# Patient Record
Sex: Female | Born: 1935 | Race: White | Hispanic: No | Marital: Married | Smoking: Former smoker
Health system: Southern US, Community
[De-identification: ages and names within clinical notes are randomized; demographics above are authoritative.]

## PROBLEM LIST (undated history)

## (undated) DIAGNOSIS — I341 Nonrheumatic mitral (valve) prolapse: Secondary | ICD-10-CM

## (undated) DIAGNOSIS — F329 Major depressive disorder, single episode, unspecified: Secondary | ICD-10-CM

## (undated) DIAGNOSIS — F32A Depression, unspecified: Secondary | ICD-10-CM

## (undated) HISTORY — PX: APPENDECTOMY: SHX54

## (undated) HISTORY — PX: OTHER SURGICAL HISTORY: SHX169

---

## 1997-08-01 ENCOUNTER — Other Ambulatory Visit: Admission: RE | Admit: 1997-08-01 | Discharge: 1997-08-01 | Payer: Self-pay | Admitting: Urology

## 1997-08-08 ENCOUNTER — Encounter: Admission: RE | Admit: 1997-08-08 | Discharge: 1997-11-06 | Payer: Self-pay | Admitting: *Deleted

## 1997-08-08 ENCOUNTER — Other Ambulatory Visit: Admission: RE | Admit: 1997-08-08 | Discharge: 1997-08-08 | Payer: Self-pay | Admitting: *Deleted

## 1998-03-26 ENCOUNTER — Other Ambulatory Visit: Admission: RE | Admit: 1998-03-26 | Discharge: 1998-03-26 | Payer: Self-pay | Admitting: *Deleted

## 1998-07-29 ENCOUNTER — Ambulatory Visit (HOSPITAL_COMMUNITY): Admission: RE | Admit: 1998-07-29 | Discharge: 1998-07-29 | Payer: Self-pay | Admitting: Obstetrics & Gynecology

## 1999-05-13 ENCOUNTER — Encounter: Admission: RE | Admit: 1999-05-13 | Discharge: 1999-05-13 | Payer: Self-pay | Admitting: *Deleted

## 1999-05-13 ENCOUNTER — Encounter: Payer: Self-pay | Admitting: *Deleted

## 1999-05-15 ENCOUNTER — Other Ambulatory Visit: Admission: RE | Admit: 1999-05-15 | Discharge: 1999-05-15 | Payer: Self-pay | Admitting: *Deleted

## 2003-02-14 ENCOUNTER — Encounter: Payer: Self-pay | Admitting: Family Medicine

## 2003-02-14 ENCOUNTER — Encounter: Admission: RE | Admit: 2003-02-14 | Discharge: 2003-02-14 | Payer: Self-pay | Admitting: Family Medicine

## 2006-01-31 ENCOUNTER — Encounter: Admission: RE | Admit: 2006-01-31 | Discharge: 2006-01-31 | Payer: Self-pay | Admitting: Family Medicine

## 2006-10-07 ENCOUNTER — Encounter: Admission: RE | Admit: 2006-10-07 | Discharge: 2006-10-07 | Payer: Self-pay | Admitting: Family Medicine

## 2007-05-30 ENCOUNTER — Ambulatory Visit: Payer: Self-pay | Admitting: Internal Medicine

## 2010-03-13 ENCOUNTER — Encounter: Admission: RE | Admit: 2010-03-13 | Discharge: 2010-03-13 | Payer: Self-pay | Admitting: Family Medicine

## 2010-09-15 NOTE — Assessment & Plan Note (Signed)
[facility]                         GASTROENTEROLOGY OFFICE NOTE   NAME:STRICKLANDAubrea, Meixner                 MRN:          [id number]  DATE:05/30/2007                            DOB:          Sep 18, 1918    HISTORY:  Ms. Matera is a delightful, 77 year old white female who  comes today complaining of change in her bowel habits.  She is a former  doctor of Sam Reynolds's patient who had a screening colonoscopy in  October 2003 with findings of diverticulitis of the left colon.  She had  a moderately severe sigmoid diverticulosis of a tortuous elongated  redundant colon.  She maintains her bowel habits by taking 1 teaspoon of  Cilium every day at bedtime.  Her bowel habits were regular until  recently when she started having more constipation.  Her eating habits  are quite healthy.  She eats blue[place] which [place]  She also eats a little asparagus which they grow.  She works out at the __________ every day.  She does water aerobics, and  her weight has been maintained around 118 pounds.  [place].   MEDICATIONS:  Multiple vitamins, B complex, Vitamin C, calcium  supplements, magnesium and cilium 1 teaspoon q h.s.   PAST HISTORY:  Significant for depression 1959 to 1962, mitral valve  prolapse, benign breast cyst biopsies in 1993, appendectomy in 1937.  She had a pelvic sling by Dr. Patsi Sears in 1989.  She has been a  regular blood donor, given 17-1/2 gallons of blood to [facility].   FAMILY HISTORY:  Negative for colon cancer, positive for diabetes,  breast cancer and heart disease.   SOCIAL HISTORY:  Married with four children.  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  She does not smoke.  Drinks a  glass of wine every day.   REVIEW OF SYSTEMS:  Her weight has been stable.  She wears eyeglasses.  She has back pain, has  some hearing problems and leakage of urine.   PHYSICAL EXAMINATION:  VITAL SIGNS:  Blood pressure 134/74, pulse 72 and  weight 118 pounds.  GENERAL:  She was alert, oriented, appeared younger than her stated age.  LUNGS:  Clear to auscultation.  COR:  Normal S1, normal S2.  ABDOMEN:  Soft, nontender with normoactive bowel sounds.  RECTAL EXAM:  Showed somewhat decreased rectal tone.  There was a small  amount of stool which was hemoccult negative.   IMPRESSION:  An 77 year old white female with change in her bowel habits  which is most likely due to increased colonic motility, decreased eating  habits and less activity.  There is no evidence of occult GI  blood loss  and no risk factors of colon cancer.   PLAN:  At her age, I would prefer to try to treat her constipation with  increased fiber, and if it does not work, we will consider colonoscopy.  She wanted a colonoscopy, but we will wait to see if she responds to  increased  cilium.  She will take 1 teaspoon twice a day instead of once  a day, increase her fluid intake, maintain her activity and eating  habits.  She will let us know about the results [place]  If there is any progression of her bowel  irregularities, we will consider colonoscopy before April of this year.     Hedwig Morton. Juanda Chance, MD  Electronically Signed    DMB/MedQ  DD: 05/30/2007  DT: 05/30/2007  Job #: [id number]   cc:   Gretta Arab. Valentina Lucks, M.D.

## 2011-04-05 IMAGING — US US EXTREM LOW VENOUS*L*
1 series · 14 of 24 positions shown · non-contrast
Comparison: None.

CLINICAL DATA: Fell 5 weeks ago, presenting now with 2-day history
of left lower extremity swelling.

LEFT LOWER EXTREMITY VENOUS DOPPLER ULTRASOUND 03/13/2010:
TECHNIQUE: Gray-scale sonography with compression, as well as
color and duplex Doppler ultrasound, were performed to evaluate the
deep venous system from the level of the common femoral vein
through the popliteal and proximal calf veins. Evaluation also
included physiologic response to augmentation.

[Series 1: us extrem low venous*left* · 0.09mm/px · 14 of 42 slices shown]
[im 1/42]
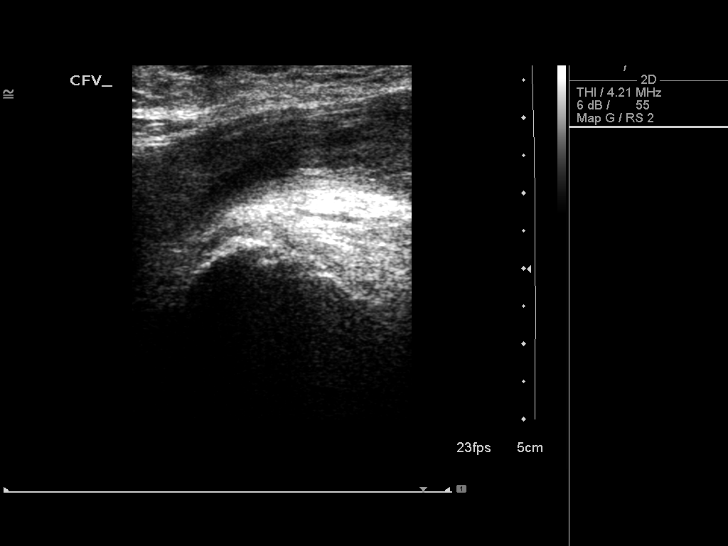
[im 4/42]
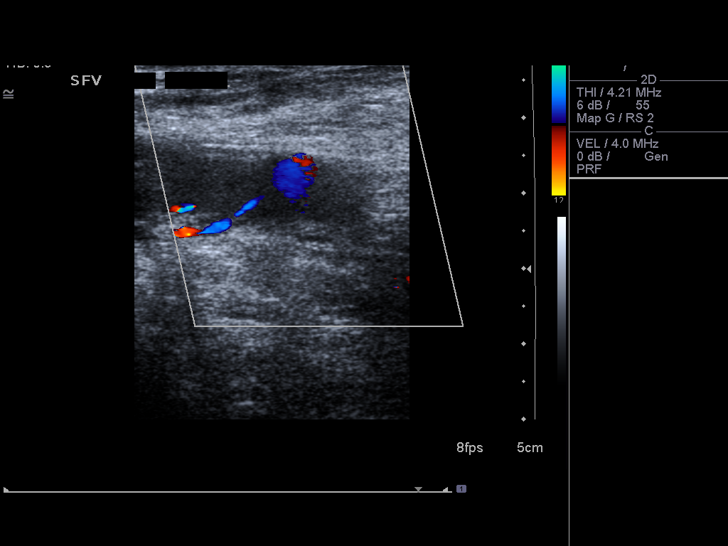
[im 8/42]
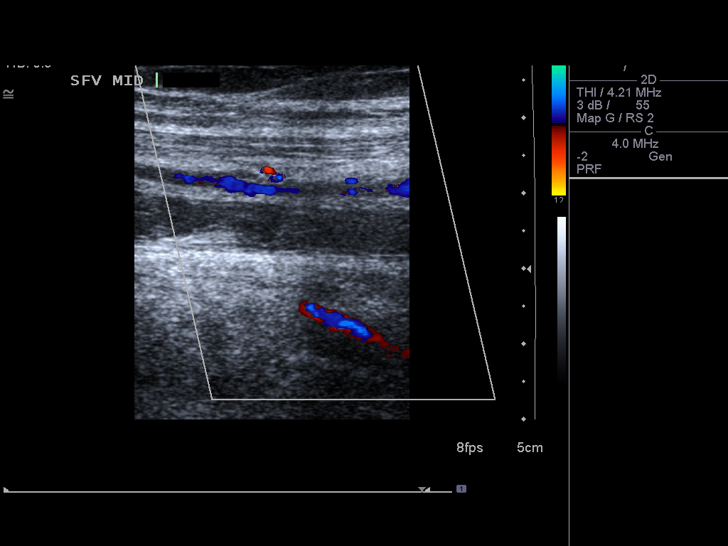
[im 11/42]
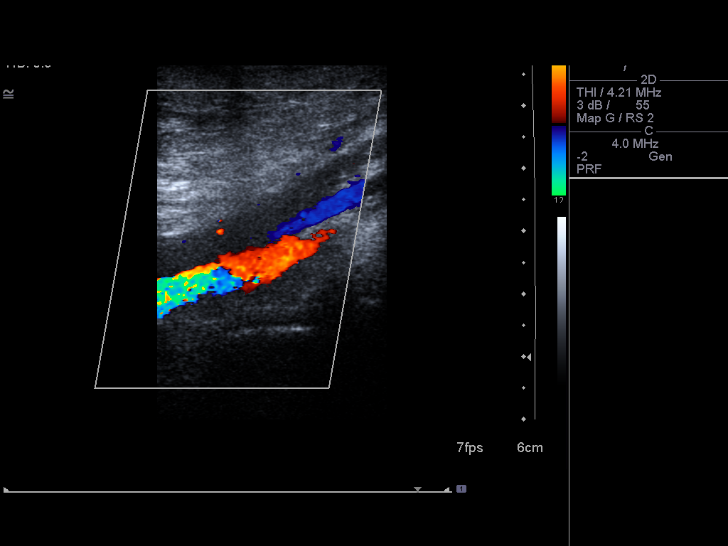
[im 13/42]
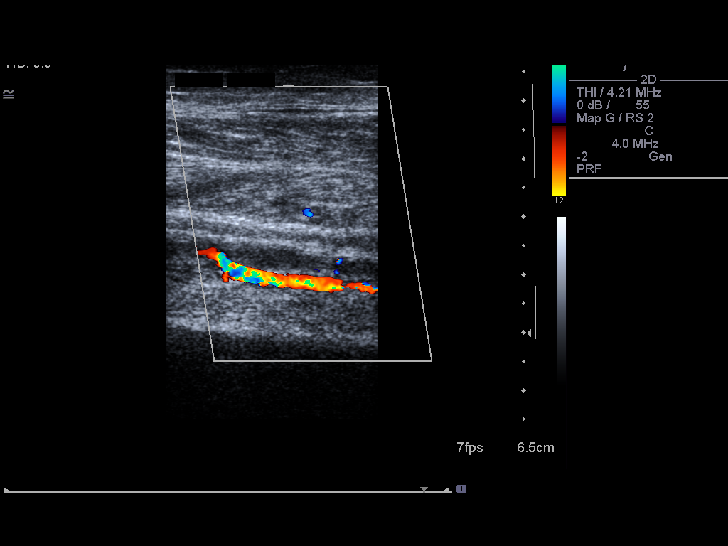
[im 17/42]
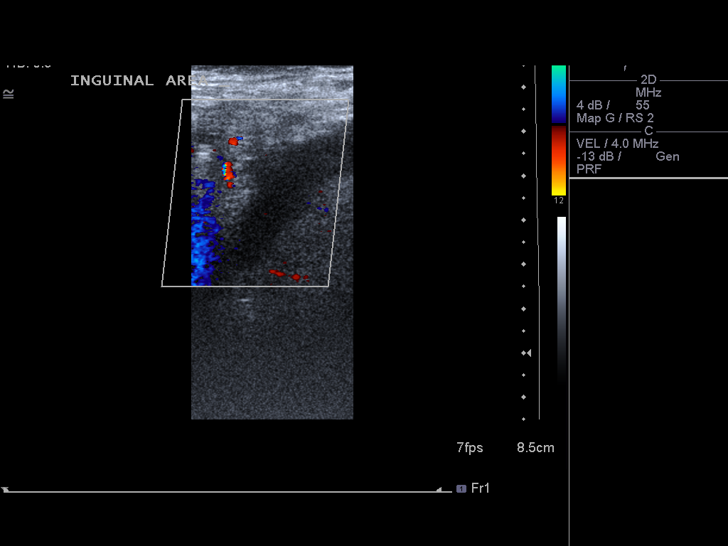
[im 20/42]
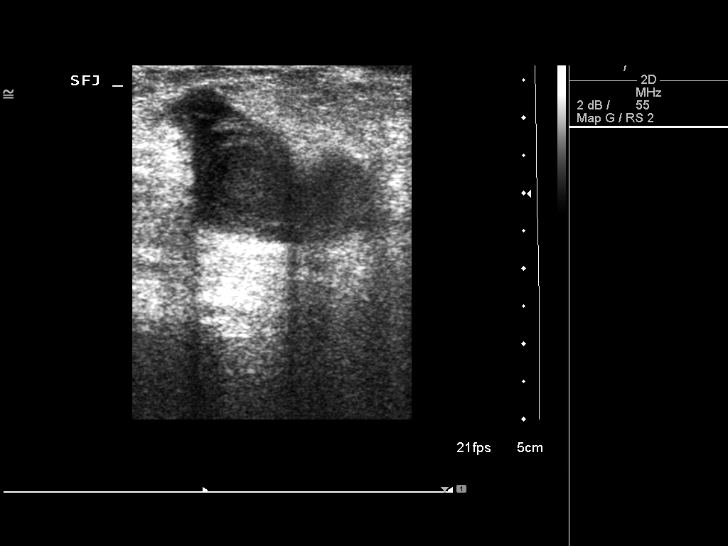
[im 22/42]
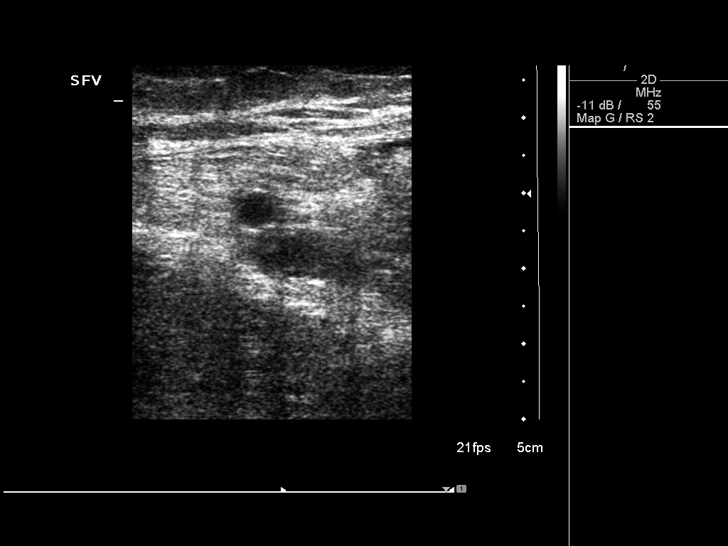
[im 25/42]
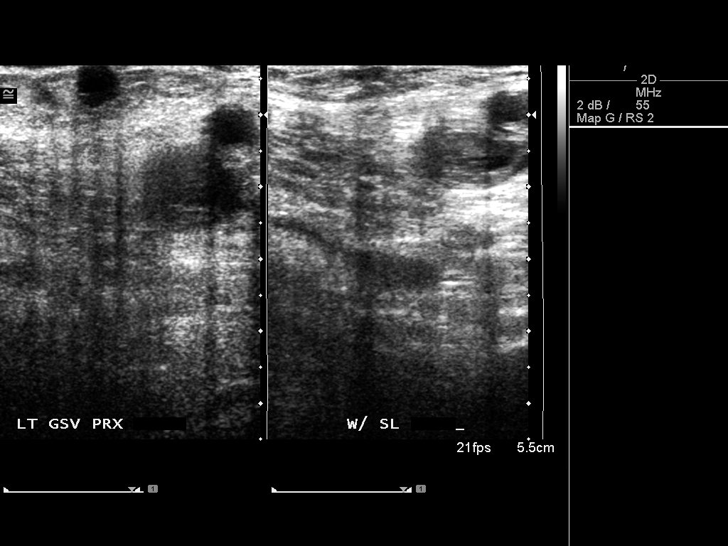
[im 29/42]
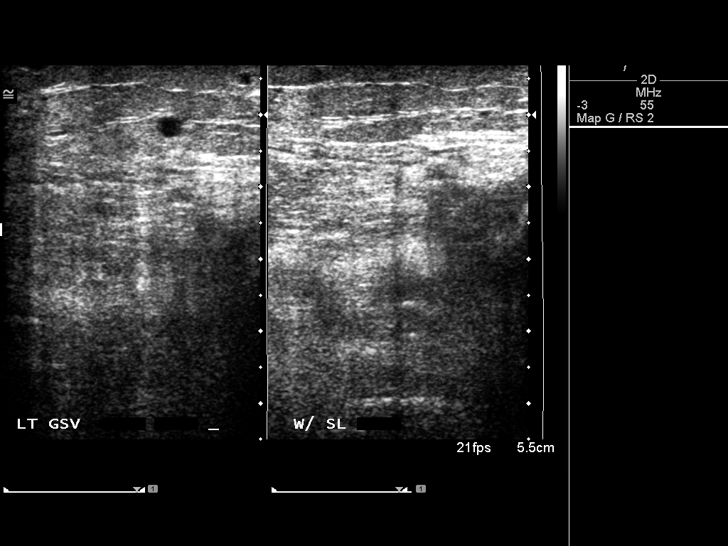
[im 33/42]
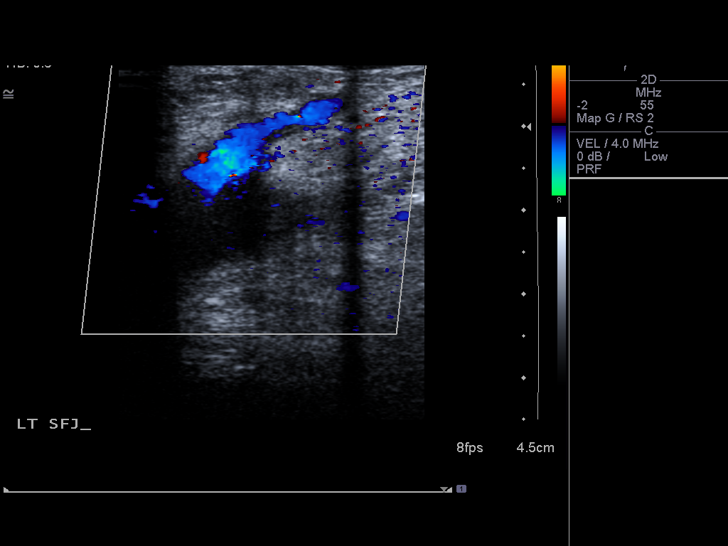
[im 34/42]
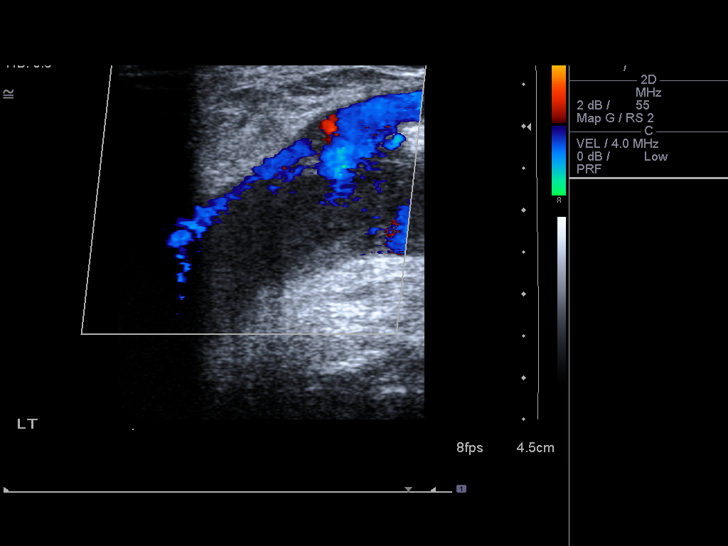
[im 38/42]
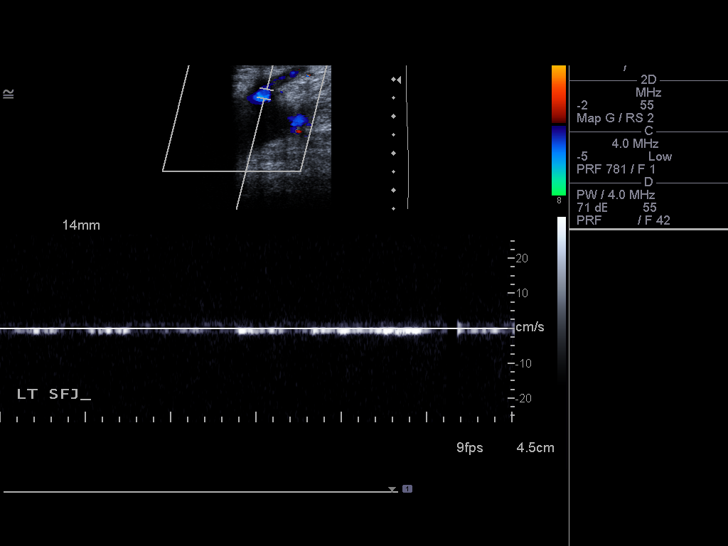
[im 42/42]
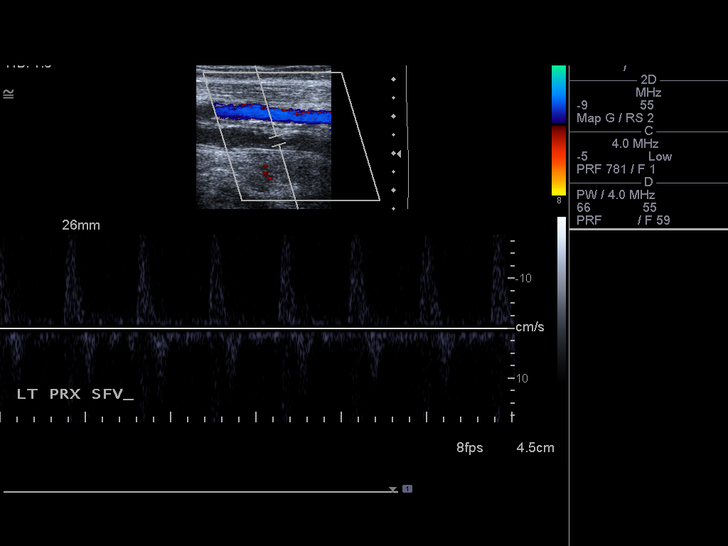

[14 of 24 positions shown; findings below may reference images not displayed]

FINDINGS: Gray scale occlusive filling defects extending from the
common femoral vein through the femoral vein, deep femoral vein,
popliteal vein, and visualized calf veins consistent with extensive
left lower extremity DVT.  Only scattered areas of minimal color
Doppler signal are identified in these veins.  Thrombus extends
into the central portion of the greater saphenous vein as it enters
the common femoral vein, though the remainder of the saphenous vein
appears patent.
IMPRESSION: Extensive left lower extremity DVT extending from the visualized
common femoral vein through the femoral vein, deep femoral vein,
popliteal vein, and visualized calf veins.

## 2014-01-25 ENCOUNTER — Emergency Department (HOSPITAL_COMMUNITY)
Admission: EM | Admit: 2014-01-25 | Discharge: 2014-01-25 | Disposition: A | Discharge status: Home / Self Care | Payer: Medicare Other | Attending: Emergency Medicine | Admitting: Emergency Medicine

## 2014-01-25 ENCOUNTER — Encounter (HOSPITAL_COMMUNITY): Payer: Self-pay | Admitting: Emergency Medicine

## 2014-01-25 DIAGNOSIS — M542 Cervicalgia: Secondary | ICD-10-CM | POA: Diagnosis not present

## 2014-01-25 DIAGNOSIS — Z87891 Personal history of nicotine dependence: Secondary | ICD-10-CM | POA: Insufficient documentation

## 2014-01-25 LAB — CBC WITH DIFFERENTIAL/PLATELET
BASOS ABS: 0 10*3/uL (ref 0.0–0.1)
Basophils Relative: 0 % (ref 0–1)
Eosinophils Absolute: 0.1 10*3/uL (ref 0.0–0.7)
Eosinophils Relative: 1 % (ref 0–5)
HCT: 43.7 % (ref 36.0–46.0)
HEMOGLOBIN: 14.8 g/dL (ref 12.0–15.0)
LYMPHS PCT: 14 % (ref 12–46)
Lymphs Abs: 1 10*3/uL (ref 0.7–4.0)
MCH: 31 pg (ref 26.0–34.0)
MCHC: 33.9 g/dL (ref 30.0–36.0)
MCV: 91.4 fL (ref 78.0–100.0)
Monocytes Absolute: 0.4 10*3/uL (ref 0.1–1.0)
Monocytes Relative: 5 % (ref 3–12)
NEUTROS ABS: 5.7 10*3/uL (ref 1.7–7.7)
NEUTROS PCT: 80 % — AB (ref 43–77)
Platelets: 211 10*3/uL (ref 150–400)
RBC: 4.78 MIL/uL (ref 3.87–5.11)
RDW: 13 % (ref 11.5–15.5)
WBC: 7.2 10*3/uL (ref 4.0–10.5)

## 2014-01-25 LAB — I-STAT CHEM 8, ED
BUN: 21 mg/dL (ref 6–23)
CHLORIDE: 103 meq/L (ref 96–112)
Calcium, Ion: 1.17 mmol/L (ref 1.13–1.30)
Creatinine, Ser: 0.7 mg/dL (ref 0.50–1.10)
Glucose, Bld: 102 mg/dL — ABNORMAL HIGH (ref 70–99)
HEMATOCRIT: 48 % — AB (ref 36.0–46.0)
Hemoglobin: 16.3 g/dL — ABNORMAL HIGH (ref 12.0–15.0)
POTASSIUM: 4 meq/L (ref 3.7–5.3)
Sodium: 140 mEq/L (ref 137–147)
TCO2: 25 mmol/L (ref 0–100)

## 2014-01-25 MED ORDER — MORPHINE SULFATE 4 MG/ML IJ SOLN
2.0000 mg | Freq: Once | INTRAMUSCULAR | Status: AC
  Administered 2014-01-25: 2 mg via INTRAVENOUS
  Filled 2014-01-25: qty 1

## 2014-01-25 NOTE — Progress Notes (Signed)
PTAR arranged to transport pt back to [facility] [place].

## 2014-01-25 NOTE — ED Notes (Signed)
Pt daughter has called back and case manager was able to talk to her and discuss concerns about her being forgetful etc.

## 2014-01-25 NOTE — ED Notes (Signed)
Case Management + Social worker at bedside.

## 2014-01-25 NOTE — ED Notes (Signed)
Pt complaining of neck pain radiating into head.

## 2014-01-25 NOTE — Discharge Instructions (Signed)
Please call your doctor for a followup appointment within 24-48 hours. When you talk to your doctor please let them know that you were seen in the emergency department and have them acquire all of your records so that they can discuss the findings with you and formulate a treatment plan to fully care for your new and ongoing problems.  Return for fever, vomiting, worsening pain.

## 2014-01-25 NOTE — ED Notes (Signed)
Per EMS: complains of ha, pt hypertensive 184/77. No obvious weaknesses, a/o x4. CBG = 101. 71bpm. Pt denies any falls/trauma to head. From [facility].

## 2014-01-25 NOTE — Clinical Social Work Note (Signed)
Clinical Social Worker received notification from CM that patient having memory issues and possible concerns with returning to independent living.  CSW and CM met with patient at bedside to offer support and discuss patient wishes for potential placement needs.  Patient is not able to accurately articulate her current needs or potential need for higher level of care.  CSW spoke with [facility] who states that they have noticed a significant change with patient confusion in the last several months and have offered a variety of services to come into patient home to assist - patient has refused.    CSW spoke with patient daughter over the phone who states that this behavior is similar to patient baseline and if patient is medically stable she will return to independent living at [facility] with additional services starting on Monday to assist.  CSW to arrange ambulance transport back to [place].  Barbette Or, [place]

## 2014-01-25 NOTE — ED Provider Notes (Signed)
CSN: [id number]     Arrival date & time 01/25/14  1007 History   First MD Initiated Contact with Patient 01/25/14 1016     Chief Complaint  Patient presents with  . Neck Pain     (Consider location/radiation/quality/duration/timing/severity/associated sxs/prior Treatment) HPI Comments: 80 year old female, presents from her living facility at [facility] with a complaint of neck stiffness and pain. She states that when she went to sleep last night she had no complaints, she awoke in the middle of the evening with neck pain which she states is persistent, worse with range of motion and prevents her from looking to the side or putting her chin on her chest. She denies fevers chills nausea vomiting coughing shortness of breath chest pain back pain leg pain arm pain weakness numbness rashes swelling visual changes or sore throat. She has no pain in her ears, no nasal congestion, no medications given prior to arrival. Paramedics noted that the patient was hypertensive 184/77, CBG was 101 and her pulse was 71 beats per minute.  Patient is a 80 y.o. female presenting with neck pain. The history is provided by the patient.  Neck Pain   History reviewed. No pertinent past medical history. History reviewed. No pertinent past surgical history. History reviewed. No pertinent family history. History  Substance Use Topics  . Smoking status: Former Games developer  . Smokeless tobacco: Not on file  . Alcohol Use: No   OB History   Grav Para Term Preterm Abortions TAB SAB Ect Mult Living                 Review of Systems  Musculoskeletal: Positive for neck pain.  All other systems reviewed and are negative.     Allergies  Review of patient's allergies indicates no known allergies.  Home Medications   Prior to Admission medications   Not on File   BP 170/64  Pulse 86  Temp(Src) 99.6 F (37.6 C) (Oral)  Resp 16  Ht  (1.651 m)  Wt 115 lb (52.164 kg)  BMI 19.14 kg/m2  SpO2 97% Physical  Exam  Nursing note and vitals reviewed. Constitutional: She appears well-developed and well-nourished. No distress.  HENT:  Head: Normocephalic and atraumatic.  Mouth/Throat: Oropharynx is clear and moist. No oropharyngeal exudate.  Eyes: Conjunctivae and EOM are normal. Pupils are equal, round, and reactive to light. Right eye exhibits no discharge. Left eye exhibits no discharge. No scleral icterus.  Neck: No JVD present. No thyromegaly present.  Restrictive range of motion of the neck, tenderness over the bilateral neck, posterior neck and paracervical muscles  Cardiovascular: Normal rate, regular rhythm, normal heart sounds and intact distal pulses.  Exam reveals no gallop and no friction rub.   No murmur heard. Pulmonary/Chest: Effort normal and breath sounds normal. No respiratory distress. She has no wheezes. She has no rales.  Abdominal: Soft. Bowel sounds are normal. She exhibits no distension and no mass. There is no tenderness.  Musculoskeletal: Normal range of motion. She exhibits no edema and no tenderness.  Lymphadenopathy:    She has no cervical adenopathy.  Neurological: She is alert. Coordination normal.  Skin: Skin is warm and dry. No rash noted. No erythema.  Psychiatric: She has a normal mood and affect. Her behavior is normal.    ED Course  Procedures (including critical care time) Labs Review Labs Reviewed  CBC WITH DIFFERENTIAL - Abnormal; Notable for the following:    Neutrophils Relative % 80 (*)    All other  components within normal limits  I-STAT CHEM 8, ED - Abnormal; Notable for the following:    Glucose, Bld 102 (*)    Hemoglobin 16.3 (*)    HCT 48.0 (*)    All other components within normal limits    Imaging Review No results found.    MDM   Final diagnoses:  Neck pain    Other than the patient's stiff neck there are no other signs or symptoms concerning including her mental status, neurologic exam her vital signs which show no fever, no  tachycardia and no hypotension. We'll obtain laboratory workup, get some pain medication, reevaluate.  Pt with normal labs, pain improved and now has supple neck, she has taken her monitoring wires off, and has been able to ambulate - she is currently living in independent living At [facility] - SW has assisted in contacting the facility and assisting with helping her get elevated level of care which she appears to need.  Pt stable to go home with this extra assistance.  Meds given in ED:  Medications  morphine 4 MG/ML injection 2 mg (2 mg Intravenous Given 01/25/14 1110)    There are no discharge medications for this patient.       Vida Roller, MD 01/25/14 [phone]

## 2014-01-25 NOTE — ED Notes (Signed)
Social work has called for ptar to come transport patient back to [facility]

## 2014-01-25 NOTE — ED Notes (Signed)
Spoke with pt's doctor's office at [facility]. Verifies no medical hx other than left lower extremity DVT 03/13/2010.

## 2014-04-16 ENCOUNTER — Emergency Department (HOSPITAL_COMMUNITY): Payer: Medicare Other

## 2014-04-16 ENCOUNTER — Observation Stay (HOSPITAL_COMMUNITY)
Admission: EM | Admit: 2014-04-16 | Discharge: 2014-04-17 | Disposition: A | Discharge status: Home / Self Care | Payer: Medicare Other | Attending: Internal Medicine | Admitting: Internal Medicine

## 2014-04-16 ENCOUNTER — Encounter (HOSPITAL_COMMUNITY): Payer: Self-pay

## 2014-04-16 DIAGNOSIS — F32A Depression, unspecified: Secondary | ICD-10-CM

## 2014-04-16 DIAGNOSIS — I10 Essential (primary) hypertension: Secondary | ICD-10-CM

## 2014-04-16 DIAGNOSIS — M47896 Other spondylosis, lumbar region: Secondary | ICD-10-CM | POA: Insufficient documentation

## 2014-04-16 DIAGNOSIS — F329 Major depressive disorder, single episode, unspecified: Secondary | ICD-10-CM | POA: Diagnosis not present

## 2014-04-16 DIAGNOSIS — M419 Scoliosis, unspecified: Secondary | ICD-10-CM | POA: Insufficient documentation

## 2014-04-16 DIAGNOSIS — I700 Atherosclerosis of aorta: Secondary | ICD-10-CM | POA: Diagnosis not present

## 2014-04-16 DIAGNOSIS — Y998 Other external cause status: Secondary | ICD-10-CM | POA: Diagnosis not present

## 2014-04-16 DIAGNOSIS — F0390 Unspecified dementia without behavioral disturbance: Secondary | ICD-10-CM | POA: Diagnosis not present

## 2014-04-16 DIAGNOSIS — M791 Myalgia: Secondary | ICD-10-CM | POA: Diagnosis present

## 2014-04-16 DIAGNOSIS — M4806 Spinal stenosis, lumbar region: Secondary | ICD-10-CM | POA: Insufficient documentation

## 2014-04-16 DIAGNOSIS — M7918 Myalgia, other site: Secondary | ICD-10-CM

## 2014-04-16 DIAGNOSIS — Y9389 Activity, other specified: Secondary | ICD-10-CM | POA: Insufficient documentation

## 2014-04-16 DIAGNOSIS — W19XXXA Unspecified fall, initial encounter: Secondary | ICD-10-CM | POA: Diagnosis present

## 2014-04-16 DIAGNOSIS — Y92098 Other place in other non-institutional residence as the place of occurrence of the external cause: Secondary | ICD-10-CM | POA: Diagnosis not present

## 2014-04-16 DIAGNOSIS — I341 Nonrheumatic mitral (valve) prolapse: Secondary | ICD-10-CM | POA: Diagnosis not present

## 2014-04-16 DIAGNOSIS — W1839XA Other fall on same level, initial encounter: Secondary | ICD-10-CM | POA: Insufficient documentation

## 2014-04-16 DIAGNOSIS — M545 Low back pain: Secondary | ICD-10-CM | POA: Diagnosis not present

## 2014-04-16 DIAGNOSIS — Z87891 Personal history of nicotine dependence: Secondary | ICD-10-CM | POA: Diagnosis not present

## 2014-04-16 HISTORY — DX: Major depressive disorder, single episode, unspecified: F32.9

## 2014-04-16 HISTORY — DX: Nonrheumatic mitral (valve) prolapse: I34.1

## 2014-04-16 HISTORY — DX: Depression, unspecified: F32.A

## 2014-04-16 MED ORDER — HEPARIN SODIUM (PORCINE) 5000 UNIT/ML IJ SOLN
5000.0000 [IU] | Freq: Three times a day (TID) | INTRAMUSCULAR | Status: DC
Start: 2014-04-17 — End: 2014-04-17
  Administered 2014-04-17 (×2): 5000 [IU] via SUBCUTANEOUS
  Filled 2014-04-16 (×5): qty 1

## 2014-04-16 MED ORDER — SENNA 8.6 MG PO TABS
1.0000 | ORAL_TABLET | Freq: Two times a day (BID) | ORAL | Status: DC
  Administered 2014-04-17 (×2): 8.6 mg via ORAL
  Filled 2014-04-16 (×2): qty 1

## 2014-04-16 MED ORDER — ACETAMINOPHEN 500 MG PO TABS
1000.0000 mg | ORAL_TABLET | Freq: Three times a day (TID) | ORAL | Status: DC
  Administered 2014-04-17 (×3): 1000 mg via ORAL
  Filled 2014-04-16 (×5): qty 2

## 2014-04-16 MED ORDER — DONEPEZIL HCL 10 MG PO TABS
10.0000 mg | ORAL_TABLET | Freq: Every day | ORAL | Status: DC
  Administered 2014-04-17: 10 mg via ORAL
  Filled 2014-04-16: qty 1

## 2014-04-16 MED ORDER — OXYCODONE HCL 5 MG PO TABS: 5.0000 mg | ORAL_TABLET | ORAL | Status: DC | PRN

## 2014-04-16 MED ORDER — POLYETHYLENE GLYCOL 3350 17 G PO PACK
17.0000 g | PACK | Freq: Every day | ORAL | Status: DC | PRN
  Filled 2014-04-16: qty 1

## 2014-04-16 MED ORDER — ONDANSETRON HCL 4 MG PO TABS: 4.0000 mg | ORAL_TABLET | Freq: Four times a day (QID) | ORAL | Status: DC | PRN

## 2014-04-16 MED ORDER — HALOPERIDOL LACTATE 5 MG/ML IJ SOLN: 2.0000 mg | Freq: Four times a day (QID) | INTRAMUSCULAR | Status: DC | PRN

## 2014-04-16 MED ORDER — HYDRALAZINE HCL 20 MG/ML IJ SOLN: 5.0000 mg | INTRAMUSCULAR | Status: DC | PRN

## 2014-04-16 MED ORDER — HYDROCODONE-ACETAMINOPHEN 5-325 MG PO TABS: 0.5000 | ORAL_TABLET | Freq: Once | ORAL | Status: DC

## 2014-04-16 MED ORDER — ACETAMINOPHEN 325 MG PO TABS
650.0000 mg | ORAL_TABLET | Freq: Once | ORAL | Status: AC
  Administered 2014-04-16: 650 mg via ORAL
  Filled 2014-04-16: qty 2

## 2014-04-16 MED ORDER — ONDANSETRON HCL 4 MG/2ML IJ SOLN: 4.0000 mg | Freq: Four times a day (QID) | INTRAMUSCULAR | Status: DC | PRN

## 2014-04-16 NOTE — ED Provider Notes (Addendum)
CSN: [id number]     Arrival date & time 04/16/14  2008 History   First MD Initiated Contact with Patient 04/16/14 2025     Chief Complaint  Patient presents with  . Fall     (Consider location/radiation/quality/duration/timing/severity/associated sxs/prior Treatment) HPI  80 year old female presents after a fall. She states she was walking and is unsure exactly how she fell. She states she did not get dizzy, short of breath, or have chest pain. She states this is not atypical for her to fall like this. Is having upper and lower back pain since the fall. Denies any weakness or numbness. Is able to range her legs normally. No hip pain. Unable to rate the severity of her pain. Has not taken anything for pain. Did not hit her head or lose consciousness.   History reviewed. No pertinent past medical history. History reviewed. No pertinent past surgical history. History reviewed. No pertinent family history. History  Substance Use Topics  . Smoking status: Former Games developermoker  . Smokeless tobacco: Not on file  . Alcohol Use: No   OB History    No data available     Review of Systems  Respiratory: Negative for shortness of breath.   Cardiovascular: Negative for chest pain.  Gastrointestinal: Negative for vomiting and abdominal pain.  Musculoskeletal: Positive for back pain. Negative for neck pain.  Neurological: Negative for weakness, numbness and headaches.      Allergies  Review of patient's allergies indicates no known allergies.  Home Medications   Prior to Admission medications   Not on File   BP 153/77 mmHg  Pulse 79  Temp(Src) 98.1 F (36.7 C) (Oral)  Resp 18  SpO2 94% Physical Exam  Constitutional: She is oriented to person, place, and time. She appears well-developed and well-nourished.  HENT:  Head: Normocephalic and atraumatic.  Right Ear: External ear normal.  Left Ear: External ear normal.  Nose: Nose normal.  Eyes: Right eye exhibits no discharge. Left eye  exhibits no discharge.  Cardiovascular: Normal rate, regular rhythm and normal heart sounds.   Pulmonary/Chest: Effort normal and breath sounds normal.  Abdominal: Soft. She exhibits no distension. There is no tenderness.  Musculoskeletal:       Thoracic back: She exhibits tenderness and bony tenderness.       Lumbar back: She exhibits tenderness and bony tenderness.  Neurological: She is alert and oriented to person, place, and time.  Normal strength in all 4 extremities. Grossly normal sensation.  Skin: Skin is warm and dry.  Nursing note and vitals reviewed.   ED Course  Procedures (including critical care time) Labs Review Labs Reviewed - No data to display  Imaging Review Dg Thoracic Spine 2 View  04/16/2014   CLINICAL DATA:  Fall from standing. Lower back pain. Initial encounter  EXAM: THORACIC SPINE - 2 VIEW  COMPARISON:  10/07/2006 chest x-ray  FINDINGS: Mild compression deformity of the T7 and T12 vertebral bodies which is similar to comparison chest radiography. No acute fracture is suspected. No subluxation.  There is diffuse degenerative disc narrowing.  Osteopenia.  IMPRESSION: 1. No acute fracture identified. 2. Remote T12 and T7 compression fractures with mild height loss.   Electronically Signed   By: Tiburcio PeaJonathan  Watts M.D.   On: 04/16/2014 22:00   Dg Lumbar Spine Complete  04/16/2014   CLINICAL DATA:  Patient fell from standing position.  Back pain  EXAM: LUMBAR SPINE - COMPLETE 4+ VIEW  COMPARISON:  None.  FINDINGS: Frontal, lateral, spot  lumbosacral lateral, and bilateral oblique views were obtained. There are 5 non-rib-bearing lumbar type vertebral bodies. There is lumbar levoscoliosis with mild rotatory component. There is no fracture or spondylolisthesis. There is severe disc space narrowing at L1-2, L2-3, and L3-4. There is moderate narrowing at L4-5. There is facet osteoarthritic change at all levels bilaterally. There is atherosclerotic change in the aorta. There is  mild narrowing of both hip joints.  IMPRESSION: Multilevel osteoarthritic change. Scoliosis. No fracture or spondylolisthesis. Atherosclerotic calcification present.   Electronically Signed   By: Bretta BangWilliam  Woodruff M.D.   On: 04/16/2014 21:59     EKG Interpretation None      MDM   Final diagnoses:  Fall, initial encounter    Patient has no evidence of fracture. No tenderness over her hips. Her neurologic exam is normal. Patient was given Tylenol but still has severe pain. We'll give narcotics but patient is unable to get up due to the amount of pain she is in. She lives in independent living and does not have any help for tonight. Given the time of night it would be impossible to get her help acutely tonight. I discussed with her daughter, Kara MeadMartha Betts, who lives 2-3 hours away and is unable to come tonight. Due to this decision was made to admit the patient overnight for pain control and monitoring given she cannot take care of herself at this time.    Audree CamelScott T Melbert Botelho, MD 04/17/14 [id number]  Audree CamelScott T Delainy Mcelhiney, MD 04/17/14 [phone]

## 2014-04-16 NOTE — H&P (Signed)
[facility] History and Physical  Jo SitesFrances D Mensch UJW:[id number]:[id number] DOB: 12/28/1918 DOA: 04/16/2014  Referring physician: Dr. Oletta CohnGolston - [facility] PCP: No primary care provider on file.   Chief Complaint: Fall  HPI: Jo Carr is a 80 y.o. female  Pt [facility] where she had a witnessed fall w/o syncope. No reported head trauma. On presentation pt complaining only of back pain. No family available for further care this evening. ED worked up pain and no evidence of fracture. As pt in independent living w/o family in town, pt is w/o care at home and is demented at baseline.   Level 5 Caveat   Review of Systems:  Pt demented and unable to give further ROS other than that she felll but otherwise feels to be in her normal state of health.   Past Medical History  Diagnosis Date  . Mitral valve prolapse   . Depression    Past Surgical History  Procedure Laterality Date  . Appendectomy    . Pelvic sling     Social History:  reports that she has quit smoking. She does not have any smokeless tobacco history on file. She reports that she does not drink alcohol. Her drug history is not on file.  No Known Allergies  Family History  Problem Relation Age of Onset  . Diabetes    . Breast cancer    . Heart disease       Prior to Admission medications   Medication Sig Start Date End Date Taking? Authorizing Provider  donepezil (ARICEPT) 10 MG tablet Take 10 mg by mouth at bedtime.   Yes Historical Provider, MD   Physical Exam: Filed Vitals:   04/16/14 2200 04/16/14 2230 04/16/14 2300 04/16/14 2333  BP: 176/55 172/83 140/63 173/64  Pulse: 79 72  78  Temp:    98.2 F (36.8 C)  TempSrc:    Oral  Resp: 21 16 23 16   Weight:    52 kg (114 lb 10.2 oz)  SpO2: 92% 89%  95%    Wt Readings from Last 3 Encounters:  04/16/14 52 kg (114 lb 10.2 oz)  01/25/14 52.164 kg (115 lb)    General: elderly and frail appearing  Eyes:   PERRL, normal lids, irises & conjunctiva ENT: grossly normal hearing, lips & tongue Neck:  no LAD, masses or thyromegaly Cardiovascular: RRR, no m/r/g. No LE edema. Telemetry:  SR, no arrhythmias  Respiratory:  CTA bilaterally, no w/r/r. Normal respiratory effort. Abdomen:  soft, ntnd Skin:  no rash or induration seen on limited exam Musculoskeletal: Back ttp along the lower thoracic adn lumbar regions  Psychiatric: Pt answers most questions appropriately. AAO to person adn month. pleasant Neurologic:  grossly non-focal.          Labs on Admission:  Basic Metabolic Panel: No results for input(s): NA, K, CL, CO2, GLUCOSE, BUN, CREATININE, CALCIUM, MG, PHOS in the last 168 hours. Liver Function Tests: No results for input(s): AST, ALT, ALKPHOS, BILITOT, PROT, ALBUMIN in the last 168 hours. No results for input(s): LIPASE, AMYLASE in the last 168 hours. No results for input(s): AMMONIA in the last 168 hours. CBC: No results for input(s): WBC, NEUTROABS, HGB, HCT, MCV, PLT in the last 168 hours. Cardiac Enzymes: No results for input(s): CKTOTAL, CKMB, CKMBINDEX, TROPONINI in the last 168 hours.  BNP (last 3 results) No results for input(s): PROBNP in the last 8760 hours. CBG: No results for input(s): GLUCAP in the last 168  hours.  Radiological Exams on Admission: Dg Thoracic Spine 2 View  04/16/2014   CLINICAL DATA:  Fall from standing. Lower back pain. Initial encounter  EXAM: THORACIC SPINE - 2 VIEW  COMPARISON:  10/07/2006 chest x-ray  FINDINGS: Mild compression deformity of the T7 and T12 vertebral bodies which is similar to comparison chest radiography. No acute fracture is suspected. No subluxation.  There is diffuse degenerative disc narrowing.  Osteopenia.  IMPRESSION: 1. No acute fracture identified. 2. Remote T12 and T7 compression fractures with mild height loss.   Electronically Signed   By: Tiburcio PeaJonathan  Watts M.D.   On: 04/16/2014 22:00   Dg Lumbar Spine  Complete  04/16/2014   CLINICAL DATA:  Patient fell from standing position.  Back pain  EXAM: LUMBAR SPINE - COMPLETE 4+ VIEW  COMPARISON:  None.  FINDINGS: Frontal, lateral, spot lumbosacral lateral, and bilateral oblique views were obtained. There are 5 non-rib-bearing lumbar type vertebral bodies. There is lumbar levoscoliosis with mild rotatory component. There is no fracture or spondylolisthesis. There is severe disc space narrowing at L1-2, L2-3, and L3-4. There is moderate narrowing at L4-5. There is facet osteoarthritic change at all levels bilaterally. There is atherosclerotic change in the aorta. There is mild narrowing of both hip joints.  IMPRESSION: Multilevel osteoarthritic change. Scoliosis. No fracture or spondylolisthesis. Atherosclerotic calcification present.   Electronically Signed   By: Bretta BangWilliam  Woodruff M.D.   On: 04/16/2014 21:59    EKG: Pending  Assessment/Plan Active Problems:   Fall   Musculoskeletal pain   Dementia   Essential hypertension   Depression  80yo F presenting from independent living facility after fall w/ PMH of dementia, HTN, depression.   Fall: sounds to have been mechanical per reports. Nothing in history to suggest orthostatics, seizure, arrhythmia, chemistry imbalance. Plain films of thoracic and lumbar spin w/o evidence of fracture. Pt in considerable pain at this time and would be unable to care for self at independent living and is w/o family nearby until 04/17/14. - Admit as obs, ED physician discussed care plan w/ family and how pt did not meet inpt criteria and the family is aware they may receive a hospital bill for care.  - tylenol 1gm TID - oxycodone only for severe pain - Senna adn miralax ( anticipate constipation) - EKG - orthostatics - PT/OT - CMET, CBC, UA  Dementia: Pt AAO to month and person only. Unsure of baseline.  - continue aricept - Haldol PRN  HTN: Pt noted to be hypertensive at previous ED visit and today. No medical  records available from [facility].  - hydralazine PRN SBP >190 - recommend outpt antihypertensives if remains elevated   Social: Pt in need of higher level of care than independent living. Staff at [facility] were w/o any medical information for the pt. Spoke to AlexandriaMartha, pts daughter, about her mother, and said there was no one from the family able to come to [place] tonight to care for the pt. Was unable to give any further medical information. Of note woke pts daughter up from sleep.  - CSW - recommendations for higher level of care   Code Status: FULL DVT Prophylaxis: Hep Family Communication: Daughter Disposition Plan: obs - transfer to [facility] in the am   Shiheem Corporan J [facility] [email or web address] Password [email or web address]

## 2014-04-16 NOTE — ED Notes (Signed)
Bed: RESB Expected date: 04/16/14 Expected time: 7:49 PM Means of arrival: Ambulance Comments: 80 yr old fall, thoracic pain

## 2014-04-16 NOTE — Progress Notes (Signed)
CSW attempted to meet with patient at bedside. However, patient was not present.   Trish MageBrittney Gerrod Maule, LCSWA [phone] ED CSW 04/16/2014 9:37 PM

## 2014-04-16 NOTE — ED Notes (Signed)
Pt states she fell from a standing position, she only complains of pain with range of motion

## 2014-04-17 DIAGNOSIS — W19XXXD Unspecified fall, subsequent encounter: Secondary | ICD-10-CM

## 2014-04-17 LAB — CBC WITH DIFFERENTIAL/PLATELET
Basophils Absolute: 0 10*3/uL (ref 0.0–0.1)
Basophils Relative: 0 % (ref 0–1)
EOS ABS: 0 10*3/uL (ref 0.0–0.7)
Eosinophils Relative: 0 % (ref 0–5)
HCT: 43.3 % (ref 36.0–46.0)
HEMOGLOBIN: 13.7 g/dL (ref 12.0–15.0)
LYMPHS ABS: 1.6 10*3/uL (ref 0.7–4.0)
Lymphocytes Relative: 17 % (ref 12–46)
MCH: 29.8 pg (ref 26.0–34.0)
MCHC: 31.6 g/dL (ref 30.0–36.0)
MCV: 94.3 fL (ref 78.0–100.0)
MONOS PCT: 6 % (ref 3–12)
Monocytes Absolute: 0.6 10*3/uL (ref 0.1–1.0)
NEUTROS ABS: 7.3 10*3/uL (ref 1.7–7.7)
Neutrophils Relative %: 77 % (ref 43–77)
Platelets: 240 10*3/uL (ref 150–400)
RBC: 4.59 MIL/uL (ref 3.87–5.11)
RDW: 12.8 % (ref 11.5–15.5)
WBC: 9.5 10*3/uL (ref 4.0–10.5)

## 2014-04-17 LAB — COMPREHENSIVE METABOLIC PANEL
ALBUMIN: 3.4 g/dL — AB (ref 3.5–5.2)
ALT: 17 U/L (ref 0–35)
AST: 21 U/L (ref 0–37)
Alkaline Phosphatase: 67 U/L (ref 39–117)
Anion gap: 13 (ref 5–15)
BUN: 17 mg/dL (ref 6–23)
CALCIUM: 9.5 mg/dL (ref 8.4–10.5)
CO2: 26 mEq/L (ref 19–32)
Chloride: 100 mEq/L (ref 96–112)
Creatinine, Ser: 0.71 mg/dL (ref 0.50–1.10)
GFR calc non Af Amer: 72 mL/min — ABNORMAL LOW (ref >90)
GFR, EST AFRICAN AMERICAN: 84 mL/min — AB (ref >90)
GLUCOSE: 112 mg/dL — AB (ref 70–99)
Potassium: 3.9 mEq/L (ref 3.7–5.3)
SODIUM: 139 meq/L (ref 137–147)
TOTAL PROTEIN: 7 g/dL (ref 6.0–8.3)
Total Bilirubin: 0.5 mg/dL (ref 0.3–1.2)

## 2014-04-17 LAB — URINALYSIS, ROUTINE W REFLEX MICROSCOPIC
Bilirubin Urine: NEGATIVE
GLUCOSE, UA: NEGATIVE mg/dL
HGB URINE DIPSTICK: NEGATIVE
Ketones, ur: NEGATIVE mg/dL
Nitrite: POSITIVE — AB
Protein, ur: NEGATIVE mg/dL
SPECIFIC GRAVITY, URINE: 1.018 (ref 1.005–1.030)
Urobilinogen, UA: 0.2 mg/dL (ref 0.0–1.0)
pH: 6 (ref 5.0–8.0)

## 2014-04-17 LAB — URINE MICROSCOPIC-ADD ON

## 2014-04-17 MED ORDER — ACETAMINOPHEN 500 MG PO TABS: 1000.0000 mg | ORAL_TABLET | Freq: Three times a day (TID) | ORAL | Status: AC | PRN

## 2014-04-17 MED ORDER — POLYETHYLENE GLYCOL 3350 17 G PO PACK: 17.0000 g | PACK | Freq: Every day | ORAL | Status: AC | PRN

## 2014-04-17 MED ORDER — OXYCODONE HCL 5 MG PO TABS: 5.0000 mg | ORAL_TABLET | Freq: Four times a day (QID) | ORAL | Status: AC | PRN

## 2014-04-17 NOTE — Progress Notes (Signed)
Clinical Social Work Department BRIEF PSYCHOSOCIAL ASSESSMENT 04/17/2014  Patient:  Jo Carr, Jo Carr     Account Number:  [id number]     Admit date:  04/16/2014  Clinical Social Worker:  Earlie Server  Date/Time:  04/17/2014 10:00 AM  Referred by:  Physician  Date Referred:  04/17/2014 Referred for  SNF Placement   Other Referral:   Interview type:  Family Other interview type:   Patient has dementia and confused at times so dtr Jana Half) completed assessment.    PSYCHOSOCIAL DATA Living Status:  FACILITY Admitted from facility:  [facility] Level of care:  Independent Living Primary support name:  Jana Half Primary support relationship to patient:  CHILD, ADULT Degree of support available:   Strong    Cell # [phone]  Home # [phone]    CURRENT CONCERNS Current Concerns  Post-Acute Placement   Other Concerns:    SOCIAL WORK ASSESSMENT / PLAN CSW received referral in order to assist with DC planning. CSW reviewed chart and met with patient at bedside. Patient confused at times and unable to provide information re: DC plans so CSW contacted dtr Jana Half). CSW introduced myself and explained role.    Patient's dtr reports she lives out of town and has several medical concerns herself. Dtr reports she is unable to drive to [place] and no other family lives locally. CSW spoke with dtr re: PT evaluation and how patient was ambulating with RW. Dtr reports that patient has not been in SNF in the past and often refuses [facility] services. Dtr reports that patient is ambulating as well as she was before the fall and feels the best DC plan would be for patient to return to [facility] and family could hire caregivers.    CSW spoke with Jinny Sanders at facility who confirmed that patient lives in independent living and that they have encouraged family to hire caregivers in the past. Facility is agreeable to speak with facility to hire caregivers since they live out of town.    CSW  updated bedside RN of DC plans and facility reports they can arrange for caregivers today if patient is DC. No barriers to DC and patient can return to facility once medically stable.   Assessment/plan status:  No Further Intervention Required Other assessment/ plan:   Information/referral to community resources:   Will return to indpendent living    PATIENT'S/FAMILY'S RESPONSE TO PLAN OF CARE: Patient unable to fully participate in assessment. Dtr engaged and thanked CSW for call. Dtr reports she has been overwhelmed recently due to her own medical problems and having to manage patient's needs as well. Dtr does not feel SNF would be beneficial and believes that patient would refuse to work with therapy. Dtr agreeable to hire caregivers at [facility] and feels this would be the best situation for patient.       [place], [place] [phone]

## 2014-04-17 NOTE — Evaluation (Addendum)
Physical Therapy Evaluation Patient Details Name: Jo SitesFrances D Carr MRN: [id number] DOB: [birth year] Today's Date: 04/17/2014   History of Present Illness  80 y.o. female from [facility] where she had a witnessed fall w/o syncope. No reported head trauma. On presentation pt complaining only of back pain. Pt has h/o dementia.   Clinical Impression  *Pt admitted with above diagnosis. Pt currently with functional limitations due to the deficits listed below (see PT Problem List). ** Pt will benefit from skilled PT to increase their independence and safety with mobility to allow discharge to the venue listed below.    Noted activity orders have been changed from bed rest to up with assist.   Pt ambulated 130' with RW without loss of balance. Pt denies other falls, though due to h/o dementia this will need to be confirmed. She reports she walked with RW independently and did her ADLs without help. Will phone family to confirm her prior functional level. Pt would benefit from supervision with mobility, if [facility] can provide this she could return there. Pt was able to follow simple commands, was oriented to location and situation, though had some difficulty providing detailed info regarding prior functional level. Pt very pleasant and cooperative. She reported back pain with mobility, pain medication requested.  **    Follow Up Recommendations SNF;Supervision for mobility/OOB    Equipment Recommendations  None recommended by PT    Recommendations for Other Services       Precautions / Restrictions Precautions Precautions: Fall Restrictions Weight Bearing Restrictions: No      Mobility  Bed Mobility Overal bed mobility: Needs Assistance Bed Mobility: Supine to Sit     Supine to sit: Min assist     General bed mobility comments: min A to raise trunk  Transfers Overall transfer level: Needs assistance Equipment used: Rolling walker (2  wheeled) Transfers: Sit to/from Stand Sit to Stand: Min guard         General transfer comment: min/guard for safety, some verbal cues required for hand placement  Ambulation/Gait Ambulation/Gait assistance: Min guard Ambulation Distance (Feet): 130 Feet Assistive device: Rolling walker (2 wheeled) Gait Pattern/deviations: Step-through pattern   Gait velocity interpretation: at or above normal speed for age/gender General Gait Details: steady with RW  Stairs            Wheelchair Mobility    Modified Rankin (Stroke Patients Only)       Balance Overall balance assessment: History of Falls;Needs assistance (steady with RW in standing)   Sitting balance-Leahy Scale: Good       Standing balance-Leahy Scale: Fair                               Pertinent Vitals/Pain Pain Assessment: Faces Pain Score: 6  Pain Location: low back with movement Pain Intervention(s): Patient requesting pain meds-RN notified;Monitored during session;Limited activity within patient's tolerance    Home Living Family/patient expects to be discharged to:: Unsure                      Prior Function Level of Independence: Independent with assistive device(s)         Comments: independent with RW (per pt, need to confirm with family 2* h/o dementia); pt stated she walked to dining room with RW independently and was independent with bathing/dressing     Hand Dominance        Extremity/Trunk  Assessment   Upper Extremity Assessment: Overall WFL for tasks assessed           Lower Extremity Assessment: Overall WFL for tasks assessed      Cervical / Trunk Assessment: Normal  Communication   Communication: No difficulties  Cognition Arousal/Alertness: Awake/alert Behavior During Therapy: WFL for tasks assessed/performed Overall Cognitive Status: History of cognitive impairments - at baseline (h/o dementia, pt oriented to place and situation, able to follow  simple commands, some difficulty providing history)                      General Comments      Exercises        Assessment/Plan    PT Assessment Patient needs continued PT services  PT Diagnosis Acute pain;Difficulty walking   PT Problem List Decreased balance;Pain;Decreased knowledge of use of DME;Decreased mobility;Decreased cognition  PT Treatment Interventions Gait training;Functional mobility training;Therapeutic activities;Balance training;Therapeutic exercise   PT Goals (Current goals can be found in the Care Plan section) Acute Rehab PT Goals Patient Stated Goal: to decrease back pain PT Goal Formulation: With patient Time For Goal Achievement: 05/01/14 Potential to Achieve Goals: Good    Frequency Min 3X/week   Barriers to discharge Decreased caregiver support Pt is from [facility], it sounds like she was in independent living. She would benefit from supervision with mobility at ALF vs. SNF.     Co-evaluation               End of Session Equipment Utilized During Treatment: Gait belt Activity Tolerance: Patient tolerated treatment well Patient left: in chair;with call bell/phone within reach;with chair alarm set Nurse Communication: Mobility status;Patient requests pain meds    Functional Assessment Tool Used: clinical judgement Functional Limitation: Mobility: Walking and moving around Mobility: Walking and Moving Around Current Status [phone](G8978): At least 20 percent but less than 40 percent impaired, limited or restricted Mobility: Walking and Moving Around Goal Status [phone](G8979): At least 1 percent but less than 20 percent impaired, limited or restricted    Time: 0801-0821 PT Time Calculation (min) (ACUTE ONLY): 20 min   Charges:   PT Evaluation $Initial PT Evaluation Tier I: 1 Procedure PT Treatments $Gait Training: 8-22 mins   PT G Codes:   Functional Assessment Tool Used: clinical judgement Functional Limitation: Mobility: Walking and  moving around    [place] CollegeUhlenberg, Pilgrim's PrideJennifer Kistler 04/17/2014, 8:34 AM [phone]

## 2014-04-17 NOTE — Progress Notes (Signed)
Clinical Social Work  MD has DC patient. CSW spoke with [facility] who reports family has arranged to have caregivers when patient returns. [facility]. CSW made CM aware who will arrange HH prior to DC. Facility is unable to transport patient. CSW spoke with dtr via phone who reports she is unable to transport and no other family lives locally. Dtr requests [facility] for transportation and is aware of no guarantee of payment. PT[facility] #: [id number].  CSW is signing off but available if needed.  [name], KentuckyLCSW [phone]

## 2014-04-17 NOTE — Progress Notes (Signed)
PT Cancellation Note  Patient Details Name: Jo Carr MRN: [id number] DOB: 1918-11-11   Cancelled Treatment:    Reason Eval/Treat Not Completed: Other (comment) (Per orders pt is on bedrest until 10:51 this morning. Will see later today. )   Tamala SerUhlenberg, Brittin Belnap Kistler 04/17/2014, 7:51 AM  [phone]

## 2014-04-17 NOTE — Evaluation (Signed)
Occupational Therapy Evaluation Patient Details Name: Ezra SitesFrances D Older MRN: [id number] DOB: 1918-11-26 Today's Date: 04/17/2014    History of Present Illness [facility] is [facility] where she had a witnessed fall w/o syncope. She did not hit her head and c/o back pain only   Clinical Impression    This 80 year old female was admitted after a fall.  Pt has a h/o dementia and was mod I with ADLs prior to admission.  Goals in acute are set at min guard to min A level.    Follow Up Recommendations  SNF;Supervision/Assistance - 24 hour    Equipment Recommendations  3 in 1 bedside comode    Recommendations for Other Services       Precautions / Restrictions Precautions Precautions: Fall Restrictions Weight Bearing Restrictions: No      Mobility Bed Mobility            General bed mobility comments: pt up in chair  Transfers Overall transfer level: Needs assistance Equipment used: Rolling walker (2 wheeled) Transfers: Sit to/from Stand Sit to Stand: Min assist;Min guard         General transfer comment: Min A from recliner; min guard from [facility]    Balance Overall balance assessment: History of Falls   Sitting balance-Leahy Scale: Good       Standing balance-Leahy Scale: Fair Standing balance comment: min guard for safety                            ADL Overall ADL's : Needs assistance/impaired     Grooming: Wash/dry hands;Wash/dry face;Sitting;Set up   Upper Body Bathing: Sitting;Supervision/ safety   Lower Body Bathing: Moderate assistance;Sit to/from stand   Upper Body Dressing : Supervision/safety;Sitting   Lower Body Dressing: Maximal assistance;Sit to/from stand   Toilet Transfer: Minimal assistance;BSC;Stand-pivot   Toileting- ArchitectClothing Manipulation and Hygiene: Sit to/from stand;Moderate assistance         General ADL Comments: Pt transferred to commode:  had urinary incontinence; assisted with  mesh panties and pad.      Vision                     Perception     Praxis      Pertinent Vitals/Pain Pain Assessment: Faces Pain Score: 6  Faces Pain Scale: Hurts even more Pain Location: back Pain Descriptors / Indicators: Aching Pain Intervention(s): Limited activity within patient's tolerance;Monitored during session;Repositioned     Hand Dominance     Extremity/Trunk Assessment Upper Extremity Assessment Upper Extremity Assessment: Overall WFL for tasks assessed      Cervical / Trunk Assessment Cervical / Trunk Assessment: Normal   Communication Communication Communication: No difficulties   Cognition Arousal/Alertness: Awake/alert Behavior During Therapy: WFL for tasks assessed/performed Overall Cognitive Status: History of cognitive impairments - at baseline (not oriented to place but oriented to fall; decreased memory)                     General Comments       Exercises       Shoulder Instructions      Home Living Family/patient expects to be discharged to:: Unsure                                 Additional Comments: pt will likely need snf  Prior Functioning/Environment Level of Independence: Independent with assistive device(s)        Comments: independent with RW (per pt, need to confirm with family 2* h/o dementia); pt stated she walked to dining room with RW independently and was independent with bathing/dressing    OT Diagnosis: Generalized weakness   OT Problem List: Decreased strength;Impaired balance (sitting and/or standing);Pain;Decreased knowledge of use of DME or AE;Decreased cognition;Decreased safety awareness   OT Treatment/Interventions: Self-care/ADL training;Therapeutic activities;Patient/family education;Balance training;DME and/or AE instruction;Cognitive remediation/compensation    OT Goals(Current goals can be found in the care plan section) Acute Rehab OT Goals Patient Stated Goal:  to decrease back pain OT Goal Formulation: With patient Time For Goal Achievement: 04/24/14 Potential to Achieve Goals: Good ADL Goals Pt Will Perform Lower Body Bathing: with min assist;sit to/from stand Pt Will Perform Lower Body Dressing: with mod assist;sit to/from stand Pt Will Transfer to Toilet: with min guard assist;ambulating;bedside commode Pt Will Perform Toileting - Clothing Manipulation and hygiene: with min assist;sit to/from stand Additional ADL Goal #1: pt will use call bell for needs without cues  OT Frequency: Min 2X/week   Barriers to D/C:            Co-evaluation              End of Session    Activity Tolerance: Patient tolerated treatment well Patient left: in chair;with call bell/phone within reach;with chair alarm set   Time: [id number] OT Time Calculation (min): 19 min Charges:  OT General Charges $OT Visit: 1 Procedure OT Evaluation $Initial OT Evaluation Tier I: 1 Procedure OT Treatments $Self Care/Home Management : 8-22 mins G-Codes: OT G-codes **NOT FOR INPATIENT CLASS** Functional Assessment Tool Used: clinical judgment and observation Functional Limitation: Self care Self Care Current Status (A2130(G8987): At least 60 percent but less than 80 percent impaired, limited or restricted Self Care Goal Status ([id number](G8988): At least 40 percent but less than 60 percent impaired, limited or restricted  [facility] RockENCER,Hailyn Zarr 04/17/2014, 11:56 AM Marica OtterMaryellen Aviannah Castoro, OTR/L [phone] 04/17/2014

## 2014-04-17 NOTE — Progress Notes (Signed)
Discharge instructions and medications reviewed with patient. Patient states she understands she is going back to  [facility] and she will be getting help from time to time from a caregiver. Transportation arranged via [facility].

## 2014-04-17 NOTE — Progress Notes (Signed)
UR completed 

## 2014-04-17 NOTE — Discharge Summary (Signed)
Physician Discharge Summary  Jo SitesFrances D Carr ZOX:[id number] DOB: 08/03/1918 DOA: 04/16/2014  PCP: No primary care provider on file.  Admit date: 04/16/2014 Discharge date: 04/17/2014  Time spent: >30 minutes  Recommendations for Outpatient Follow-up:  Reassess BP and start antihypertensive regimen if needed Check BMET to reassess electrolytes and renal function  Discharge Diagnoses:  Active Problems:   Fall   Musculoskeletal pain   Dementia   Essential hypertension   Depression   Discharge Condition: stable. Patient to be discharge back to independent living facility with private care 24/7.  Diet recommendation: heart healthy diet   Filed Weights   04/16/14 2333  Weight: 52 kg (114 lb 10.2 oz)    History of present illness:  80 y/o female with pmh significant for dementia and HTN; who was admitted for observation after she [facility]. No syncope, no CP, no SOB. No signs of UTI, PNA or any other acute complaints. No fracture appreciated on x-rays and patient found with MSK pain and degenerative OA in her back.  Hospital Course:  1-Fall: mechanical in nature -patient with degenerative OA -found to required assistance/supervision 24/7 -will discharge with instructions to use acetaminophen for pain and if needed oxycodone for severe pain -family will arrange for private care and facility will provide PT and rehab services. -will provide 3 N 1 DME as recommended by OT  2-dementia: -continue supportive care continue aricept  3-HTN: continue low sodium/heart healthy diet -follow up with PCP to reassess BP and determine needs of antihypertensive regimen  4-hx of depression: not taking any medication currently -follow up with PCP -continue supportive care and assistance  Procedures: See below for x-rays reports   Consultations:  PT/OT (recommended SNF vs 24 hours observation and assistance; family  decided to hire 24/7 private care)  Discharge Exam: Filed Vitals:   04/17/14 0522  BP: 143/77  Pulse: 85  Temp:   Resp:     General:patient afebrile, complaining of lower back pain; no CP and no SOB Cardiovascular: S1 and s2, no rubs or gallops Respiratory: CTA bilaterally Abd: soft, NT, ND, positive BS Extremities: no edema, no cyanosis   Discharge Instructions You were cared for by a hospitalist during your hospital stay. If you have any questions about your discharge medications or the care you received while you were in the hospital after you are discharged, you can call the unit and asked to speak with the hospitalist on call if the hospitalist that took care of you is not available. Once you are discharged, your primary care physician will handle any further medical issues. Please note that NO REFILLS for any discharge medications will be authorized once you are discharged, as it is imperative that you return to your primary care physician (or establish a relationship with a primary care physician if you do not have one) for your aftercare needs so that they can reassess your need for medications and monitor your lab values.  Discharge Instructions    Discharge instructions    Complete by:  As directed   Maintain good hydration Take medications as prescribed Physical therapy and rehabilitation as per facility protocol Arrange follow up with PCP in 10 days          Current Discharge Medication List    START taking these medications   Details  acetaminophen (TYLENOL) 500 MG tablet Take 2 tablets (1,000 mg total) by mouth every 8 (eight) hours as needed for mild pain, moderate pain  or headache. Qty: 30 tablet, Refills: 0    oxyCODONE (OXY IR/ROXICODONE) 5 MG immediate release tablet Take 1 tablet (5 mg total) by mouth every 6 (six) hours as needed for severe pain. Qty: 30 tablet, Refills: 0    polyethylene glycol (MIRALAX / GLYCOLAX) packet Take 17 g by mouth daily as  needed for mild constipation. Qty: 14 each, Refills: 0      CONTINUE these medications which have NOT CHANGED   Details  donepezil (ARICEPT) 10 MG tablet Take 10 mg by mouth at bedtime.       No Known Allergies   The results of significant diagnostics from this hospitalization (including imaging, microbiology, ancillary and laboratory) are listed below for reference.    Significant Diagnostic Studies: Dg Thoracic Spine 2 View  04/16/2014   CLINICAL DATA:  Fall from standing. Lower back pain. Initial encounter  EXAM: THORACIC SPINE - 2 VIEW  COMPARISON:  10/07/2006 chest x-ray  FINDINGS: Mild compression deformity of the T7 and T12 vertebral bodies which is similar to comparison chest radiography. No acute fracture is suspected. No subluxation.  There is diffuse degenerative disc narrowing.  Osteopenia.  IMPRESSION: 1. No acute fracture identified. 2. Remote T12 and T7 compression fractures with mild height loss.   Electronically Signed   By: Tiburcio PeaJonathan  Watts M.D.   On: 04/16/2014 22:00   Dg Lumbar Spine Complete  04/16/2014   CLINICAL DATA:  Patient fell from standing position.  Back pain  EXAM: LUMBAR SPINE - COMPLETE 4+ VIEW  COMPARISON:  None.  FINDINGS: Frontal, lateral, spot lumbosacral lateral, and bilateral oblique views were obtained. There are 5 non-rib-bearing lumbar type vertebral bodies. There is lumbar levoscoliosis with mild rotatory component. There is no fracture or spondylolisthesis. There is severe disc space narrowing at L1-2, L2-3, and L3-4. There is moderate narrowing at L4-5. There is facet osteoarthritic change at all levels bilaterally. There is atherosclerotic change in the aorta. There is mild narrowing of both hip joints.  IMPRESSION: Multilevel osteoarthritic change. Scoliosis. No fracture or spondylolisthesis. Atherosclerotic calcification present.   Electronically Signed   By: Bretta BangWilliam  Woodruff M.D.   On: 04/16/2014 21:59    Labs: Basic Metabolic  Panel:  Recent Labs Lab 04/17/14 0115  NA 139  K 3.9  CL 100  CO2 26  GLUCOSE 112*  BUN 17  CREATININE 0.71  CALCIUM 9.5   Liver Function Tests:  Recent Labs Lab 04/17/14 0115  AST 21  ALT 17  ALKPHOS 67  BILITOT 0.5  PROT 7.0  ALBUMIN 3.4*   CBC:  Recent Labs Lab 04/17/14 0115  WBC 9.5  NEUTROABS 7.3  HGB 13.7  HCT 43.3  MCV 94.3  PLT 240   Signed:  Vassie LollMadera, Lovis More  [facility] 04/17/2014, 3:15 PM

## 2017-04-02 DEATH — deceased
